# Patient Record
Sex: Male | Born: 1985 | Race: Black or African American | Hispanic: No | Marital: Single | Smoking: Never smoker
Health system: Southern US, Community
[De-identification: ages and names within clinical notes are randomized; demographics above are authoritative.]

---
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
## 2008-08-26 ENCOUNTER — Emergency Department (HOSPITAL_COMMUNITY): Admission: EM | Admit: 2008-08-26 | Discharge: 2008-08-26 | Payer: Self-pay | Admitting: Emergency Medicine

## 2017-02-28 DIAGNOSIS — R300 Dysuria: Secondary | ICD-10-CM | POA: Diagnosis not present

## 2017-08-22 DIAGNOSIS — Z0000 Encounter for general adult medical examination without abnormal findings: Secondary | ICD-10-CM | POA: Diagnosis not present

## 2017-08-22 DIAGNOSIS — Z2821 Immunization not carried out because of patient refusal: Secondary | ICD-10-CM | POA: Diagnosis not present

## 2017-08-26 ENCOUNTER — Encounter: Payer: Self-pay | Admitting: Physician Assistant

## 2017-09-20 DIAGNOSIS — H5213 Myopia, bilateral: Secondary | ICD-10-CM | POA: Diagnosis not present

## 2017-09-20 DIAGNOSIS — H52223 Regular astigmatism, bilateral: Secondary | ICD-10-CM | POA: Diagnosis not present

## 2018-06-29 DIAGNOSIS — H52223 Regular astigmatism, bilateral: Secondary | ICD-10-CM | POA: Diagnosis not present

## 2018-06-29 DIAGNOSIS — H5213 Myopia, bilateral: Secondary | ICD-10-CM | POA: Diagnosis not present

## 2018-10-10 DIAGNOSIS — Z131 Encounter for screening for diabetes mellitus: Secondary | ICD-10-CM | POA: Diagnosis not present

## 2018-10-10 DIAGNOSIS — Z1339 Encounter for screening examination for other mental health and behavioral disorders: Secondary | ICD-10-CM | POA: Diagnosis not present

## 2018-10-10 DIAGNOSIS — Z1331 Encounter for screening for depression: Secondary | ICD-10-CM | POA: Diagnosis not present

## 2018-10-10 DIAGNOSIS — Z13220 Encounter for screening for lipoid disorders: Secondary | ICD-10-CM | POA: Diagnosis not present

## 2018-10-10 DIAGNOSIS — Z114 Encounter for screening for human immunodeficiency virus [HIV]: Secondary | ICD-10-CM | POA: Diagnosis not present

## 2018-10-10 DIAGNOSIS — R5383 Other fatigue: Secondary | ICD-10-CM | POA: Diagnosis not present

## 2018-10-10 DIAGNOSIS — Z0000 Encounter for general adult medical examination without abnormal findings: Secondary | ICD-10-CM | POA: Diagnosis not present

## 2018-10-10 DIAGNOSIS — R0602 Shortness of breath: Secondary | ICD-10-CM | POA: Diagnosis not present

## 2018-10-10 DIAGNOSIS — E559 Vitamin D deficiency, unspecified: Secondary | ICD-10-CM | POA: Diagnosis not present

## 2018-10-24 DIAGNOSIS — E785 Hyperlipidemia, unspecified: Secondary | ICD-10-CM | POA: Diagnosis not present

## 2018-10-24 DIAGNOSIS — Z712 Person consulting for explanation of examination or test findings: Secondary | ICD-10-CM | POA: Diagnosis not present

## 2018-10-24 DIAGNOSIS — R636 Underweight: Secondary | ICD-10-CM | POA: Diagnosis not present

## 2018-10-24 DIAGNOSIS — E559 Vitamin D deficiency, unspecified: Secondary | ICD-10-CM | POA: Diagnosis not present

## 2018-12-10 ENCOUNTER — Other Ambulatory Visit: Payer: Self-pay

## 2018-12-10 ENCOUNTER — Encounter: Payer: Self-pay | Admitting: Registered"

## 2018-12-10 ENCOUNTER — Encounter: Payer: 59 | Attending: Nurse Practitioner | Admitting: Registered"

## 2018-12-10 DIAGNOSIS — R636 Underweight: Secondary | ICD-10-CM | POA: Insufficient documentation

## 2018-12-10 NOTE — Progress Notes (Signed)
Medical Nutrition Therapy:  Appt start time: 0818 end time:  0925.  Assessment:  Primary concerns today: Pt referred due to underweight. Pt reports he has been struggling with not being able to gain weight. Pt reports he is eating as much as he can, but has not been able to gain. Pt reports he has been fluctuating between 100-105 lb for many years. Reports his highest weight was 110 lb and he last weighed that amount when he was in college around 2010-2012. Pt reports he goes to the gym regularly-3-4 times per week. He reports that he drinks protein shakes, but inconsistently. Reports he used to use a protein powder-clean weight gainer from [employer]. Has more recently been drinking OWYN protein drinks sometimes (180 calories, 20 g protein per bottle). Pt reports he has tried Ensure in the past and it made him feel full. Pt reports that he needs to work on improving his breakfast-reports he often has oatmeal while at work but wants to start having a more full breakfast. Pt reports that he eats more on the weekends when he is at home. Pt reports that he last blood work showed some elevation in blood sugar, he believed it was in the pre-diabetes range. Dietitian will request labs to have for follow-up appointment. Pt denies any concerns regarding energy level or dizziness. Reports some constipation but reports it has improved some since increasing water intake.   Food Allergies/Intolerances: None reported.   GI Concerns: Reports constipation sometimes. Reports not every week and reports improvement since increasing water some. Reports early satiety. Denies any other GI concerns (nausea, vomiting, diarrhea).   Pertinent Lab Values:  Weight Hx: 12/10/18: 99.9 lb (no shoes) 10/24/18: 101 lb   Preferred Learning Style:   No preference indicated   Learning Readiness:   Ready  MEDICATIONS: See list. Reviewed.    DIETARY INTAKE:  Usual eating pattern includes 2-3 meals and 2 snacks per day.  Meal pattern on work days: oatmeal and an apple for breakfast around 9, 1:30 pm lunch, ~7-8 pm dinner.   Common foods: oatmeal, peanuts, eggs.  Avoided foods: spaghetti.    Typical Snacks: peanuts, chips.   Typical Beverages: around 16-24 oz water; once in a while an energy drink; occasional soda  24-hr recall: Weekend day  B (10 AM): lentil soup, slice of french bread, unsweetened tea and water Snk ( AM): trail mix with nuts, water L ( PM): baked chicken, french bread, vegetables (green beans, potatoes), water  Snk ( PM): before workout banana; after workout apple D ( PM): chicken, rice, vegetables (broccoli, cauliflower), water  Snk (11 PM): peanut butter sandwich on 2 slices of Honey Wheat bread, water  Beverages: water, unsweetened tea  24-hr recall: Typical weekday day  B (AM): peanut butter sandwich, water  Snk ( AM): apple  L (2 PM): rice, chicken, water Snk ( PM): Belvita breakfast cookies, water D ( PM): lentil soup, french bread, water  Snk (PM): trail mix,  Beverages: water, unsweetened tea  Usual physical activity: treadmill, climber, elliptical. Minutes/Week: 3-4 days x 40-60 minutes each time.  Estimated energy needs: 2100-2300 calories 236-374 g carbohydrates 36 g protein 47-89g fat  Progress Towards Goal(s):  In progress.   Nutritional Diagnosis:  NB-1.1 Food and nutrition-related knowledge deficit As related to high calorie nutrition therapy.  As evidenced by pt has questions regarding how to best gain weight.    Intervention:  Nutrition counseling provided. Dietitian provided education regarding high calorie nutrition therapy. Discussed making  a habit of always having 3 meals and 2-3 snacks per day to provide more opportunities for nutrition and calories as pt reports sometimes having early satiety. Discussed strategies for increasing calories in foods given at meals and snacks-emphasized including more heart healthy fats such as olive oil, nuts and nut  butters, seeds, avocados, mayonnaise. Discussed including whole fat dairy as well to boost calories, however incorporating more of the unsaturated fat sources is most beneficial for heart health. Discussed trying Orgain supplemental drinks as pt reports he is now doing a plant-based protein drink OWYN and did not like Ensure. Discussed that the Oley Balm will provide 40-70 more calories per bottle than OWYN depending on whether pt prefers vegan or milk based Orgain. Samples of both provided. Encouraged the milk based as it contains more calories, however, if pt prefers the vegan Orgain he can do that one. Recommended doing the drink with his afternoon snack. Discussed that this is in addition to his current diet, not in place of something and that we want to space apart from a mealtime to avoid filling up on the drink and not feeling up to eating at mealtime. Discussed gradually increasing water intake to at least 48-64 oz daily as pt reports currently drinking around 24 oz per day. Pt reported that his labs showed elevated blood sugar. Dietitian will follow up with pt's doctor office to request lab values to have for next appointment. Pt appeared agreeable to information/goals discussed.   Instructions/Goals:  -Recommend including 3 meals and a snack in between each meal  Recommend having an Orgain drink once per day with one of your usual snacks.   Include high calorie ingredients at meals/snacks: foods high in unsaturated fats such as liquid cooking oils like olive oil, nuts and nut butter, seeds, and avocados provide calories and heart healthy fats. Can also include whole fat dairy as dairy source to boost calories.   See Handout   -Continue including regular physical activity.  -Water Goal: Try to gradually work to include 3-4 x 16 oz bottles of water daily   -Continue with vitamin D supplement as directed by your doctor.   Teaching Method Utilized: Visual Auditory  Handouts given during  visit include:  High Calorie Nutrition   Barriers to learning/adherence to lifestyle change: None indicated.   Demonstrated degree of understanding via:  Teach Back   Monitoring/Evaluation:  Dietary intake, exercise, and body weight in 2 month(s).

## 2018-12-10 NOTE — Patient Instructions (Addendum)
Instructions/Goals:  -Recommend including 3 meals and a snack in between each meal  Recommend having an Orgain drink once per day with one of your usual snacks.   Include high calorie ingredients at meals/snacks: foods high in unsaturated fats such as liquid cooking oils like olive oil, nuts and nut butter, seeds, and avocados provide calories and heart healthy fats. Can also include whole fat dairy as dairy source to boost calories.   See Handout   -Continue including regular physical activity.  -Water Goal: Try to gradually work to include 3-4 x 16 oz bottles of water daily   -Continue with vitamin D supplement as directed by your doctor.

## 2018-12-11 ENCOUNTER — Ambulatory Visit: Payer: Self-pay | Admitting: Registered"

## 2020-02-18 DIAGNOSIS — Z0100 Encounter for examination of eyes and vision without abnormal findings: Secondary | ICD-10-CM | POA: Diagnosis not present

## 2020-02-18 DIAGNOSIS — Z113 Encounter for screening for infections with a predominantly sexual mode of transmission: Secondary | ICD-10-CM | POA: Diagnosis not present

## 2020-02-18 DIAGNOSIS — Z0000 Encounter for general adult medical examination without abnormal findings: Secondary | ICD-10-CM | POA: Diagnosis not present

## 2020-02-18 DIAGNOSIS — Z1389 Encounter for screening for other disorder: Secondary | ICD-10-CM | POA: Diagnosis not present

## 2020-02-18 DIAGNOSIS — Z136 Encounter for screening for cardiovascular disorders: Secondary | ICD-10-CM | POA: Diagnosis not present

## 2020-02-18 DIAGNOSIS — Z131 Encounter for screening for diabetes mellitus: Secondary | ICD-10-CM | POA: Diagnosis not present

## 2020-02-18 DIAGNOSIS — Z0110 Encounter for examination of ears and hearing without abnormal findings: Secondary | ICD-10-CM | POA: Diagnosis not present

## 2020-03-17 DIAGNOSIS — R3129 Other microscopic hematuria: Secondary | ICD-10-CM | POA: Diagnosis not present

## 2020-03-17 DIAGNOSIS — D704 Cyclic neutropenia: Secondary | ICD-10-CM | POA: Diagnosis not present

## 2020-03-17 DIAGNOSIS — E559 Vitamin D deficiency, unspecified: Secondary | ICD-10-CM | POA: Diagnosis not present

## 2020-03-17 DIAGNOSIS — R808 Other proteinuria: Secondary | ICD-10-CM | POA: Diagnosis not present

## 2020-08-14 DIAGNOSIS — H5213 Myopia, bilateral: Secondary | ICD-10-CM | POA: Diagnosis not present

## 2021-03-23 DIAGNOSIS — Z0000 Encounter for general adult medical examination without abnormal findings: Secondary | ICD-10-CM | POA: Diagnosis not present

## 2021-03-23 DIAGNOSIS — Z1389 Encounter for screening for other disorder: Secondary | ICD-10-CM | POA: Diagnosis not present

## 2021-03-23 DIAGNOSIS — Z131 Encounter for screening for diabetes mellitus: Secondary | ICD-10-CM | POA: Diagnosis not present

## 2021-03-23 DIAGNOSIS — Z113 Encounter for screening for infections with a predominantly sexual mode of transmission: Secondary | ICD-10-CM | POA: Diagnosis not present

## 2021-03-23 DIAGNOSIS — E559 Vitamin D deficiency, unspecified: Secondary | ICD-10-CM | POA: Diagnosis not present

## 2021-03-31 DIAGNOSIS — E559 Vitamin D deficiency, unspecified: Secondary | ICD-10-CM | POA: Diagnosis not present

## 2021-03-31 DIAGNOSIS — R808 Other proteinuria: Secondary | ICD-10-CM | POA: Diagnosis not present

## 2021-03-31 DIAGNOSIS — D704 Cyclic neutropenia: Secondary | ICD-10-CM | POA: Diagnosis not present

## 2021-08-17 DIAGNOSIS — H5213 Myopia, bilateral: Secondary | ICD-10-CM | POA: Diagnosis not present

## 2022-05-11 DIAGNOSIS — D704 Cyclic neutropenia: Secondary | ICD-10-CM | POA: Diagnosis not present

## 2022-05-11 DIAGNOSIS — Z136 Encounter for screening for cardiovascular disorders: Secondary | ICD-10-CM | POA: Diagnosis not present

## 2022-05-11 DIAGNOSIS — Z131 Encounter for screening for diabetes mellitus: Secondary | ICD-10-CM | POA: Diagnosis not present

## 2022-05-11 DIAGNOSIS — Z0001 Encounter for general adult medical examination with abnormal findings: Secondary | ICD-10-CM | POA: Diagnosis not present

## 2022-05-11 DIAGNOSIS — E559 Vitamin D deficiency, unspecified: Secondary | ICD-10-CM | POA: Diagnosis not present

## 2022-05-11 DIAGNOSIS — R808 Other proteinuria: Secondary | ICD-10-CM | POA: Diagnosis not present

## 2022-10-19 DIAGNOSIS — H5213 Myopia, bilateral: Secondary | ICD-10-CM | POA: Diagnosis not present

## 2022-11-16 DIAGNOSIS — Z113 Encounter for screening for infections with a predominantly sexual mode of transmission: Secondary | ICD-10-CM | POA: Diagnosis not present

## 2022-11-16 DIAGNOSIS — R300 Dysuria: Secondary | ICD-10-CM | POA: Diagnosis not present

## 2023-01-25 DIAGNOSIS — Z1329 Encounter for screening for other suspected endocrine disorder: Secondary | ICD-10-CM | POA: Diagnosis not present

## 2023-01-25 DIAGNOSIS — Z136 Encounter for screening for cardiovascular disorders: Secondary | ICD-10-CM | POA: Diagnosis not present

## 2023-01-25 DIAGNOSIS — R634 Abnormal weight loss: Secondary | ICD-10-CM | POA: Diagnosis not present

## 2023-01-25 DIAGNOSIS — R808 Other proteinuria: Secondary | ICD-10-CM | POA: Diagnosis not present

## 2023-01-25 DIAGNOSIS — D704 Cyclic neutropenia: Secondary | ICD-10-CM | POA: Diagnosis not present

## 2023-01-25 DIAGNOSIS — Z0001 Encounter for general adult medical examination with abnormal findings: Secondary | ICD-10-CM | POA: Diagnosis not present

## 2023-01-25 DIAGNOSIS — N529 Male erectile dysfunction, unspecified: Secondary | ICD-10-CM | POA: Diagnosis not present

## 2023-01-25 DIAGNOSIS — E559 Vitamin D deficiency, unspecified: Secondary | ICD-10-CM | POA: Diagnosis not present

## 2023-01-25 DIAGNOSIS — Z131 Encounter for screening for diabetes mellitus: Secondary | ICD-10-CM | POA: Diagnosis not present

## 2023-02-08 DIAGNOSIS — D704 Cyclic neutropenia: Secondary | ICD-10-CM | POA: Diagnosis not present

## 2023-02-08 DIAGNOSIS — E559 Vitamin D deficiency, unspecified: Secondary | ICD-10-CM | POA: Diagnosis not present

## 2023-02-08 DIAGNOSIS — N529 Male erectile dysfunction, unspecified: Secondary | ICD-10-CM | POA: Diagnosis not present

## 2023-08-09 DIAGNOSIS — D704 Cyclic neutropenia: Secondary | ICD-10-CM | POA: Diagnosis not present

## 2023-08-09 DIAGNOSIS — E559 Vitamin D deficiency, unspecified: Secondary | ICD-10-CM | POA: Diagnosis not present

## 2023-08-09 DIAGNOSIS — N529 Male erectile dysfunction, unspecified: Secondary | ICD-10-CM | POA: Diagnosis not present

## 2023-10-23 DIAGNOSIS — H5213 Myopia, bilateral: Secondary | ICD-10-CM | POA: Diagnosis not present

## 2024-02-08 ENCOUNTER — Other Ambulatory Visit: Payer: Self-pay

## 2024-02-08 ENCOUNTER — Encounter (HOSPITAL_COMMUNITY): Payer: Self-pay | Admitting: Emergency Medicine

## 2024-02-08 ENCOUNTER — Emergency Department (HOSPITAL_COMMUNITY)
Admission: EM | Admit: 2024-02-08 | Discharge: 2024-02-09 | Discharge status: Against Medical Advice | Attending: Emergency Medicine | Admitting: Emergency Medicine

## 2024-02-08 DIAGNOSIS — Z5321 Procedure and treatment not carried out due to patient leaving prior to being seen by health care provider: Secondary | ICD-10-CM | POA: Diagnosis not present

## 2024-02-08 DIAGNOSIS — Y92410 Unspecified street and highway as the place of occurrence of the external cause: Secondary | ICD-10-CM | POA: Insufficient documentation

## 2024-02-08 DIAGNOSIS — S0181XA Laceration without foreign body of other part of head, initial encounter: Secondary | ICD-10-CM | POA: Diagnosis not present
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
## 2024-02-09 ENCOUNTER — Emergency Department (HOSPITAL_COMMUNITY)

## 2024-02-09 DIAGNOSIS — S0181XA Laceration without foreign body of other part of head, initial encounter: Secondary | ICD-10-CM | POA: Diagnosis not present

## 2024-02-09 NOTE — ED Notes (Signed)
 Patient left.

## 2024-05-07 DIAGNOSIS — E559 Vitamin D deficiency, unspecified: Secondary | ICD-10-CM | POA: Diagnosis not present

## 2024-05-07 DIAGNOSIS — N529 Male erectile dysfunction, unspecified: Secondary | ICD-10-CM | POA: Diagnosis not present

## 2024-05-07 DIAGNOSIS — Z67A1 Duffy null: Secondary | ICD-10-CM | POA: Diagnosis not present

## 2024-05-07 DIAGNOSIS — Z0001 Encounter for general adult medical examination with abnormal findings: Secondary | ICD-10-CM | POA: Diagnosis not present
